# Patient Record
Sex: Female | Born: 1975 | Race: Black or African American | Hispanic: No | Marital: Single | Smoking: Never smoker
Health system: Southern US, Community
[De-identification: ages and names within clinical notes are randomized; demographics above are authoritative.]

## PROBLEM LIST (undated history)

## (undated) DIAGNOSIS — R51 Headache: Secondary | ICD-10-CM

## (undated) DIAGNOSIS — I10 Essential (primary) hypertension: Secondary | ICD-10-CM

## (undated) DIAGNOSIS — O139 Gestational [pregnancy-induced] hypertension without significant proteinuria, unspecified trimester: Secondary | ICD-10-CM

---

## 2011-01-23 ENCOUNTER — Inpatient Hospital Stay (HOSPITAL_COMMUNITY)
Admission: AD | Admit: 2011-01-23 | Discharge: 2011-01-23 | Disposition: A | Discharge status: Home / Self Care | Payer: Medicaid Other | Source: Ambulatory Visit | Attending: Family Medicine | Admitting: Family Medicine

## 2011-01-23 DIAGNOSIS — O0930 Supervision of pregnancy with insufficient antenatal care, unspecified trimester: Secondary | ICD-10-CM | POA: Insufficient documentation

## 2011-01-23 DIAGNOSIS — O479 False labor, unspecified: Secondary | ICD-10-CM

## 2011-01-23 DIAGNOSIS — O4700 False labor before 37 completed weeks of gestation, unspecified trimester: Secondary | ICD-10-CM | POA: Insufficient documentation

## 2011-01-23 LAB — URINALYSIS, ROUTINE W REFLEX MICROSCOPIC
Nitrite: NEGATIVE
Specific Gravity, Urine: >1.03 — ABNORMAL HIGH (ref 1.005–1.030)
pH: 6 (ref 5.0–8.0)

## 2011-04-12 ENCOUNTER — Other Ambulatory Visit (HOSPITAL_COMMUNITY): Payer: Self-pay | Admitting: Obstetrics & Gynecology

## 2011-04-12 DIAGNOSIS — O3660X0 Maternal care for excessive fetal growth, unspecified trimester, not applicable or unspecified: Secondary | ICD-10-CM

## 2011-04-12 DIAGNOSIS — O480 Post-term pregnancy: Secondary | ICD-10-CM

## 2011-04-14 ENCOUNTER — Encounter (HOSPITAL_COMMUNITY): Payer: Self-pay

## 2011-04-14 ENCOUNTER — Ambulatory Visit (HOSPITAL_COMMUNITY): Payer: Medicaid Other

## 2011-04-14 ENCOUNTER — Other Ambulatory Visit (HOSPITAL_COMMUNITY): Payer: Self-pay | Admitting: Obstetrics & Gynecology

## 2011-04-14 ENCOUNTER — Ambulatory Visit (HOSPITAL_COMMUNITY)
Admission: RE | Admit: 2011-04-14 | Discharge: 2011-04-14 | Disposition: A | Discharge status: Home / Self Care | Payer: Medicaid Other | Source: Ambulatory Visit | Attending: Obstetrics & Gynecology | Admitting: Obstetrics & Gynecology

## 2011-04-14 DIAGNOSIS — O3660X0 Maternal care for excessive fetal growth, unspecified trimester, not applicable or unspecified: Secondary | ICD-10-CM

## 2011-04-14 DIAGNOSIS — O365990 Maternal care for other known or suspected poor fetal growth, unspecified trimester, not applicable or unspecified: Secondary | ICD-10-CM | POA: Insufficient documentation

## 2011-04-14 DIAGNOSIS — O139 Gestational [pregnancy-induced] hypertension without significant proteinuria, unspecified trimester: Secondary | ICD-10-CM | POA: Insufficient documentation

## 2011-04-14 DIAGNOSIS — O480 Post-term pregnancy: Secondary | ICD-10-CM | POA: Insufficient documentation

## 2011-04-18 ENCOUNTER — Inpatient Hospital Stay (HOSPITAL_COMMUNITY)
Admission: RE | Admit: 2011-04-18 | Discharge: 2011-04-21 | DRG: 775 | Disposition: A | Discharge status: Home / Self Care | Payer: Medicaid Other | Source: Ambulatory Visit | Attending: Obstetrics & Gynecology | Admitting: Obstetrics & Gynecology

## 2011-04-18 DIAGNOSIS — O139 Gestational [pregnancy-induced] hypertension without significant proteinuria, unspecified trimester: Principal | ICD-10-CM | POA: Diagnosis present

## 2011-04-18 LAB — RPR: RPR Ser Ql: NONREACTIVE

## 2011-04-18 LAB — CBC
Hemoglobin: 10 g/dL — ABNORMAL LOW (ref 12.0–15.0)
Hemoglobin: 9.9 g/dL — ABNORMAL LOW (ref 12.0–15.0)
MCHC: 32.4 g/dL (ref 30.0–36.0)
Platelets: 212 10*3/uL (ref 150–400)
Platelets: 199 10*3/uL (ref 150–400)
RBC: 3.51 MIL/uL — ABNORMAL LOW (ref 3.87–5.11)
RBC: 3.48 MIL/uL — ABNORMAL LOW (ref 3.87–5.11)

## 2011-04-18 LAB — ABO/RH: ABO/RH(D): O POS

## 2011-04-19 ENCOUNTER — Encounter (HOSPITAL_COMMUNITY): Payer: Self-pay | Admitting: Obstetrics & Gynecology

## 2011-04-19 LAB — CBC
MCV: 89.1 fL (ref 78.0–100.0)
Platelets: 186 10*3/uL (ref 150–400)
RBC: 3.11 MIL/uL — ABNORMAL LOW (ref 3.87–5.11)
WBC: 13.4 10*3/uL — ABNORMAL HIGH (ref 4.0–10.5)

## 2011-04-20 LAB — COMPREHENSIVE METABOLIC PANEL
ALT: 18 U/L (ref 0–35)
AST: 23 U/L (ref 0–37)
Albumin: 2.2 g/dL — ABNORMAL LOW (ref 3.5–5.2)
Alkaline Phosphatase: 101 U/L (ref 39–117)
CO2: 26 mEq/L (ref 19–32)
Chloride: 104 mEq/L (ref 96–112)
Creatinine, Ser: 0.75 mg/dL (ref 0.50–1.10)
GFR calc non Af Amer: >60 mL/min (ref >60)
Potassium: 3.9 mEq/L (ref 3.5–5.1)
Total Bilirubin: 0.1 mg/dL — ABNORMAL LOW (ref 0.3–1.2)

## 2011-04-20 LAB — URIC ACID: Uric Acid, Serum: 4.9 mg/dL (ref 2.4–7.0)

## 2011-04-20 LAB — URINALYSIS, ROUTINE W REFLEX MICROSCOPIC
Bilirubin Urine: NEGATIVE
Glucose, UA: NEGATIVE mg/dL
Ketones, ur: NEGATIVE mg/dL
Protein, ur: NEGATIVE mg/dL
pH: 6 (ref 5.0–8.0)

## 2011-04-20 LAB — URINE MICROSCOPIC-ADD ON

## 2011-04-21 LAB — DIFFERENTIAL
Basophils Absolute: 0 10*3/uL (ref 0.0–0.1)
Basophils Relative: 0 % (ref 0–1)
Lymphocytes Relative: 29 % (ref 12–46)
Monocytes Absolute: 0.4 10*3/uL (ref 0.1–1.0)
Neutro Abs: 5.2 10*3/uL (ref 1.7–7.7)
Neutrophils Relative %: 60 % (ref 43–77)

## 2011-04-21 LAB — COMPREHENSIVE METABOLIC PANEL
ALT: 25 U/L (ref 0–35)
Albumin: 2.2 g/dL — ABNORMAL LOW (ref 3.5–5.2)
Alkaline Phosphatase: 99 U/L (ref 39–117)
BUN: 8 mg/dL (ref 6–23)
Chloride: 103 mEq/L (ref 96–112)
GFR calc Af Amer: >60 mL/min (ref >60)
Glucose, Bld: 78 mg/dL (ref 70–99)
Potassium: 3.6 mEq/L (ref 3.5–5.1)
Sodium: 135 mEq/L (ref 135–145)
Total Bilirubin: 0.2 mg/dL — ABNORMAL LOW (ref 0.3–1.2)
Total Protein: 6.2 g/dL (ref 6.0–8.3)

## 2011-04-21 LAB — CBC
HCT: 27.5 % — ABNORMAL LOW (ref 36.0–46.0)
Hemoglobin: 8.8 g/dL — ABNORMAL LOW (ref 12.0–15.0)
MCHC: 32 g/dL (ref 30.0–36.0)
WBC: 8.7 10*3/uL (ref 4.0–10.5)

## 2011-06-06 ENCOUNTER — Inpatient Hospital Stay (HOSPITAL_COMMUNITY): Admission: RE | Admit: 2011-06-06 | Discharge: 2011-06-06 | Payer: Medicaid Other | Source: Ambulatory Visit

## 2011-06-08 ENCOUNTER — Encounter (HOSPITAL_COMMUNITY)
Admission: RE | Admit: 2011-06-08 | Discharge: 2011-06-08 | Disposition: A | Discharge status: Home / Self Care | Payer: Medicaid Other | Source: Ambulatory Visit | Attending: Obstetrics & Gynecology | Admitting: Obstetrics & Gynecology

## 2011-06-08 ENCOUNTER — Other Ambulatory Visit: Payer: Self-pay | Admitting: Obstetrics & Gynecology

## 2011-06-08 ENCOUNTER — Encounter (HOSPITAL_COMMUNITY): Payer: Self-pay

## 2011-06-08 HISTORY — DX: Headache: R51

## 2011-06-08 HISTORY — DX: Essential (primary) hypertension: I10

## 2011-06-08 LAB — CBC
Hemoglobin: 11.1 g/dL — ABNORMAL LOW (ref 12.0–15.0)
MCH: 27.6 pg (ref 26.0–34.0)
MCHC: 32.3 g/dL (ref 30.0–36.0)
MCV: 85.6 fL (ref 78.0–100.0)
RBC: 4.02 MIL/uL (ref 3.87–5.11)

## 2011-06-08 LAB — BASIC METABOLIC PANEL
CO2: 28 mEq/L (ref 19–32)
Calcium: 9.2 mg/dL (ref 8.4–10.5)
Creatinine, Ser: 1.06 mg/dL (ref 0.50–1.10)
GFR calc non Af Amer: 59 mL/min — ABNORMAL LOW (ref >60)
Glucose, Bld: 101 mg/dL — ABNORMAL HIGH (ref 70–99)
Sodium: 136 mEq/L (ref 135–145)

## 2011-06-08 LAB — SURGICAL PCR SCREEN: MRSA, PCR: NEGATIVE

## 2011-06-08 NOTE — Patient Instructions (Addendum)
[address]  06/08/2011   Your procedure is scheduled on:  8/29  Enter through the Main Entrance of [facility] at 6 AM.  Pick up the phone at the desk and dial [phone].   Call this number if you have problems the morning of surgery: [phone]   Remember:   Do not eat food:After Midnight.  Do not drink clear liquids: After Midnight.  Take these medicines the morning of surgery with A SIP OF WATER: Labatalol as directed   Do not wear jewelry, make-up or nail polish.  Do not wear lotions, powders, or perfumes. You may wear deodorant.  Do not shave 48 hours prior to surgery.  Do not bring valuables to the hospital.  Contacts, dentures or bridgework may not be worn into surgery.  Leave suitcase in the car. After surgery it may be brought to your room.  For patients admitted to the hospital, checkout time is 11:00 AM the day of discharge.   Patients discharged the day of surgery will not be allowed to drive home.  Name and phone number of your driver: Amada Kingfisher  Boyfriend   Special Instructions: CHG Shower Use Special Wash: 1/2 bottle night before surgery and 1/2 bottle morning of surgery.   Please read over the following fact sheets that you were given: MRSA Information

## 2011-06-08 NOTE — Pre-Procedure Instructions (Signed)
7weeks post partum, breast feeding

## 2011-06-08 NOTE — Anesthesia Preprocedure Evaluation (Signed)
Anesthesia Evaluation  Name, MR# and DOB Patient awake  General Assessment Comment  Reviewed: Allergy & Precautions, H&P , Patient's Chart, lab work & pertinent test results  History of Anesthesia Complications Negative for: history of anesthetic complications  Airway Mallampati: II TM Distance: >3 FB Neck ROM: full    Dental  (+)    Pulmonary  clear to auscultation  breath sounds clear to auscultation none    Cardiovascular hypertension (instructed to take meds on DOS - had not taken them today), On Home Beta Blockers regular Normal    Neuro/Psych   Headaches   Negative Neurological ROS  Negative Psych ROS  GI/Hepatic/Renal negative GI ROS  negative Liver ROS  negative Renal ROS        Endo/Other  (+)   Morbid obesity  Abdominal   Musculoskeletal   Hematology negative hematology ROS (+)   Peds  Reproductive/Obstetrics    Anesthesia Other Findings             Anesthesia Physical Anesthesia Plan  ASA: III  Anesthesia Plan: General   Post-op Pain Management:    Induction:   Airway Management Planned:   Additional Equipment:   Intra-op Plan:   Post-operative Plan:   Informed Consent: I have reviewed the patients History and Physical, chart, labs and discussed the procedure including the risks, benefits and alternatives for the proposed anesthesia with the patient or authorized representative who has indicated his/her understanding and acceptance.   Dental Advisory Given  Plan Discussed with:   Anesthesia Plan Comments:         Anesthesia Quick Evaluation

## 2011-06-13 ENCOUNTER — Encounter (HOSPITAL_COMMUNITY): Payer: Self-pay | Admitting: Obstetrics & Gynecology

## 2011-06-13 NOTE — H&P (Signed)
April Barr is an 35 y.o. G5 P97 female who presents for laparoscopic tubal sterilization.     Past Medical History  Diagnosis Date  . Hypertension   . Headache     Past Surgical History  Procedure Date  . No past surgeries     History reviewed. No pertinent family history.  Social History:  reports that she has never smoked. She has never used smokeless tobacco. She reports that she drinks alcohol. She reports that she does not use illicit drugs.  Allergies: No Known Allergies  No prescriptions prior to admission    Review of Systems  Constitutional: Negative.   Eyes: Negative.   Respiratory: Negative.   Cardiovascular: Negative.   Gastrointestinal: Negative.   Genitourinary: Negative.   Musculoskeletal: Negative.   Skin: Negative.   Neurological: Positive for headaches.  Psychiatric/Behavioral: Negative.     unknown if currently breastfeeding. Physical Exam  Constitutional: She appears well-developed and well-nourished.  Eyes: Pupils are equal, round, and reactive to light.  Neck: Neck supple.  Cardiovascular: Normal rate and regular rhythm.   Respiratory: Effort normal and breath sounds normal.  GI: Soft.  Genitourinary: Vagina normal and uterus normal.  Musculoskeletal: Normal range of motion.  Neurological: She is alert.  Skin: Skin is warm and dry.  Psychiatric: She has a normal mood and affect.    Assessment/Plan: Laparoscopic tubal sterilization.  Patient informed that procedure is permanent and has a 11/998 failure rate.  KAPLAN,RICHARD D 06/13/2011, 5:01 PM

## 2011-06-14 ENCOUNTER — Encounter (HOSPITAL_COMMUNITY): Payer: Self-pay | Admitting: *Deleted

## 2011-06-14 ENCOUNTER — Encounter (HOSPITAL_COMMUNITY): Payer: Self-pay | Admitting: Anesthesiology

## 2011-06-14 ENCOUNTER — Ambulatory Visit (HOSPITAL_COMMUNITY): Payer: Medicaid Other | Admitting: Anesthesiology

## 2011-06-14 ENCOUNTER — Ambulatory Visit (HOSPITAL_COMMUNITY)
Admission: RE | Admit: 2011-06-14 | Discharge: 2011-06-14 | Disposition: A | Discharge status: Home / Self Care | Payer: Medicaid Other | Source: Ambulatory Visit | Attending: Obstetrics & Gynecology | Admitting: Obstetrics & Gynecology

## 2011-06-14 ENCOUNTER — Encounter (HOSPITAL_COMMUNITY)
Admission: RE | Disposition: A | Discharge status: Home / Self Care | Payer: Self-pay | Source: Ambulatory Visit | Attending: Obstetrics & Gynecology

## 2011-06-14 DIAGNOSIS — Z302 Encounter for sterilization: Secondary | ICD-10-CM | POA: Insufficient documentation

## 2011-06-14 HISTORY — PX: LAPAROSCOPIC TUBAL LIGATION: SHX1937

## 2011-06-14 SURGERY — LIGATION, FALLOPIAN TUBE, LAPAROSCOPIC
Anesthesia: General | Site: Abdomen | Laterality: Bilateral | Wound class: Clean Contaminated

## 2011-06-14 MED ORDER — OXYCODONE-ACETAMINOPHEN 5-325 MG PO TABS: 2.0000 | ORAL_TABLET | ORAL | Status: AC | PRN

## 2011-06-14 MED ORDER — ROCURONIUM BROMIDE 50 MG/5ML IV SOLN
INTRAVENOUS | Status: AC
  Filled 2011-06-14: qty 2

## 2011-06-14 MED ORDER — MIDAZOLAM HCL 2 MG/2ML IJ SOLN
INTRAMUSCULAR | Status: AC
  Filled 2011-06-14: qty 2

## 2011-06-14 MED ORDER — FENTANYL CITRATE 0.05 MG/ML IJ SOLN
INTRAMUSCULAR | Status: AC
  Filled 2011-06-14: qty 5

## 2011-06-14 MED ORDER — SUCCINYLCHOLINE CHLORIDE 20 MG/ML IJ SOLN
INTRAMUSCULAR | Status: DC | PRN
  Administered 2011-06-14: 100 mg via INTRAVENOUS

## 2011-06-14 MED ORDER — LIDOCAINE HCL (CARDIAC) 20 MG/ML IV SOLN
INTRAVENOUS | Status: DC | PRN
  Administered 2011-06-14: 100 mg via INTRAVENOUS

## 2011-06-14 MED ORDER — ONDANSETRON HCL 4 MG/2ML IJ SOLN
INTRAMUSCULAR | Status: DC | PRN
  Administered 2011-06-14: 4 mg via INTRAVENOUS

## 2011-06-14 MED ORDER — GLYCOPYRROLATE 0.2 MG/ML IJ SOLN
INTRAMUSCULAR | Status: AC
  Filled 2011-06-14: qty 2

## 2011-06-14 MED ORDER — GLYCOPYRROLATE 0.2 MG/ML IJ SOLN
INTRAMUSCULAR | Status: DC | PRN
  Administered 2011-06-14: 0.1 mg via INTRAVENOUS

## 2011-06-14 MED ORDER — KETOROLAC TROMETHAMINE 30 MG/ML IJ SOLN
INTRAMUSCULAR | Status: DC | PRN
  Administered 2011-06-14: 30 mg via INTRAVENOUS

## 2011-06-14 MED ORDER — LACTATED RINGERS IV SOLN
INTRAVENOUS | Status: DC
  Administered 2011-06-14: 09:00:00 via INTRAVENOUS
  Administered 2011-06-14: 1000 mL via INTRAVENOUS

## 2011-06-14 MED ORDER — LIDOCAINE HCL (CARDIAC) 20 MG/ML IV SOLN
INTRAVENOUS | Status: AC
  Filled 2011-06-14: qty 5

## 2011-06-14 MED ORDER — FENTANYL CITRATE 0.05 MG/ML IJ SOLN
INTRAMUSCULAR | Status: DC | PRN
  Administered 2011-06-14 (×2): 50 ug via INTRAVENOUS
  Administered 2011-06-14: 100 ug via INTRAVENOUS

## 2011-06-14 MED ORDER — ROCURONIUM BROMIDE 100 MG/10ML IV SOLN
INTRAVENOUS | Status: DC | PRN
  Administered 2011-06-14: 10 mg via INTRAVENOUS

## 2011-06-14 MED ORDER — DEXAMETHASONE SODIUM PHOSPHATE 10 MG/ML IJ SOLN
INTRAMUSCULAR | Status: AC
  Filled 2011-06-14: qty 1

## 2011-06-14 MED ORDER — NEOSTIGMINE METHYLSULFATE 1 MG/ML IJ SOLN
INTRAMUSCULAR | Status: AC
  Filled 2011-06-14: qty 10

## 2011-06-14 MED ORDER — FENTANYL CITRATE 0.05 MG/ML IJ SOLN: 1.0000 ug/kg | INTRAMUSCULAR | Status: DC | PRN

## 2011-06-14 MED ORDER — DEXAMETHASONE SODIUM PHOSPHATE 10 MG/ML IJ SOLN
INTRAMUSCULAR | Status: DC | PRN
  Administered 2011-06-14: 10 mg via INTRAVENOUS

## 2011-06-14 MED ORDER — ONDANSETRON HCL 4 MG/2ML IJ SOLN
INTRAMUSCULAR | Status: AC
  Filled 2011-06-14: qty 2

## 2011-06-14 MED ORDER — PROPOFOL 10 MG/ML IV EMUL
INTRAVENOUS | Status: AC
  Filled 2011-06-14: qty 20

## 2011-06-14 MED ORDER — MIDAZOLAM HCL 5 MG/5ML IJ SOLN
INTRAMUSCULAR | Status: DC | PRN
  Administered 2011-06-14: 2 mg via INTRAVENOUS

## 2011-06-14 MED ORDER — PROPOFOL 10 MG/ML IV EMUL
INTRAVENOUS | Status: DC | PRN
  Administered 2011-06-14: 200 mg via INTRAVENOUS

## 2011-06-14 MED ORDER — SUCCINYLCHOLINE CHLORIDE 20 MG/ML IJ SOLN
INTRAMUSCULAR | Status: AC
  Filled 2011-06-14: qty 1

## 2011-06-14 SURGICAL SUPPLY — 12 items
CATH ROBINSON RED A/P 16FR (CATHETERS) ×2 IMPLANT
CLOTH BEACON ORANGE TIMEOUT ST (SAFETY) ×2 IMPLANT
DERMABOND ADVANCED (GAUZE/BANDAGES/DRESSINGS) ×2 IMPLANT
GLOVE ECLIPSE 6.0 STRL STRAW (GLOVE) ×2 IMPLANT
GLOVE ECLIPSE 6.5 STRL STRAW (GLOVE) ×2 IMPLANT
GOWN PREVENTION PLUS LG XLONG (DISPOSABLE) ×4 IMPLANT
NEEDLE SPNL 20GX3.5 QUINCKE YW (NEEDLE) IMPLANT
PACK LAPAROSCOPY BASIN (CUSTOM PROCEDURE TRAY) ×2 IMPLANT
STRIP CLOSURE SKIN 1/4X3 (GAUZE/BANDAGES/DRESSINGS) ×2 IMPLANT
SUT VICRYL 4-0 PS2 18IN ABS (SUTURE) IMPLANT
TOWEL OR 17X24 6PK STRL BLUE (TOWEL DISPOSABLE) ×4 IMPLANT
WATER STERILE IRR 1000ML POUR (IV SOLUTION) ×2 IMPLANT

## 2011-06-14 NOTE — Anesthesia Procedure Notes (Signed)
Procedure Name: Intubation Date/Time: 06/14/2011 7:39 AM Performed by: Karleen Dolphin Pre-anesthesia Checklist: Patient identified, Timeout performed, Emergency Drugs available, Suction available and Patient being monitored Patient Re-evaluated:Patient Re-evaluated prior to inductionOxygen Delivery Method: Circle System Utilized Preoxygenation: Pre-oxygenation with 100% oxygen Intubation Type: IV induction Ventilation: Mask ventilation with difficulty, Two handed mask ventilation required and Oral airway inserted - appropriate to patient size Laryngoscope Size: Mac and 3 Grade View: Grade I Airway Equipment and Method: stylet Placement Confirmation: ETT inserted through vocal cords under direct vision,  positive ETCO2,  CO2 detector and breath sounds checked- equal and bilateral Secured at: 22 cm Tube secured with: Tape Dental Injury: Teeth and Oropharynx as per pre-operative assessment

## 2011-06-14 NOTE — Brief Op Note (Signed)
06/14/2011  8:23 AM  PATIENT:  April Barr  35 y.o. female  PRE-OPERATIVE DIAGNOSIS:  desire sterilization  POST-OPERATIVE DIAGNOSIS:  desire sterilization  PROCEDURE:  Procedure(s): LAPAROSCOPIC TUBAL LIGATION  SURGEON:  Surgeon(s): Mickel Baas  PHYSICIAN ASSISTANT:   ASSISTANTS: none   ANESTHESIA:   general  ESTIMATED BLOOD LOSS: * No blood loss amount entered *   BLOOD ADMINISTERED:none  DRAINS: none   LOCAL MEDICATIONS USED:  NONE  SPECIMEN:  No Specimen  DISPOSITION OF SPECIMEN:  N/A  COUNTS:  YES  TOURNIQUET:  * No tourniquets in log *  DICTATION #: [id number]   PLAN OF CARE: Discharge from PACU when ready  PATIENT DISPOSITION:  PACU - hemodynamically stable.   Delay start of Pharmacological VTE agent (>24hrs) due to surgical blood loss or risk of bleeding:  no

## 2011-06-14 NOTE — Transfer of Care (Signed)
  Anesthesia Post-op Note  Patient: April Barr  Procedure(s) Performed:  LAPAROSCOPIC TUBAL LIGATION  Patient Location: PACU  Anesthesia Type: General  Level of Consciousness: awake, alert  and oriented  Airway and Oxygen Therapy: Patient Spontanous Breathing  Post-op Pain: none  Post-op Assessment: Post-op Vital signs reviewed and Patient's Cardiovascular Status Stable  Post-op Vital Signs: Reviewed and stable  Complications: No apparent anesthesia complications
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
## 2011-06-15 NOTE — Anesthesia Postprocedure Evaluation (Signed)
  Anesthesia Post-op Note  Patient: April Barr  Procedure(s) Performed:  LAPAROSCOPIC TUBAL LIGATION  Patient is awake and responsive. Pain and nausea are reasonably well controlled. Vital signs are stable and clinically acceptable. Oxygen saturation is clinically acceptable. There are no apparent anesthetic complications at this time. Patient is ready for discharge.

## 2011-06-27 ENCOUNTER — Encounter (HOSPITAL_COMMUNITY): Payer: Self-pay | Admitting: Obstetrics & Gynecology

## 2011-11-24 ENCOUNTER — Encounter (HOSPITAL_COMMUNITY): Payer: Self-pay | Admitting: *Deleted

## 2011-11-24 ENCOUNTER — Inpatient Hospital Stay (HOSPITAL_COMMUNITY): Payer: Medicaid Other

## 2011-11-24 ENCOUNTER — Other Ambulatory Visit (HOSPITAL_COMMUNITY): Payer: Medicaid Other

## 2011-11-24 ENCOUNTER — Encounter (HOSPITAL_COMMUNITY): Payer: Self-pay

## 2011-11-24 ENCOUNTER — Inpatient Hospital Stay (HOSPITAL_COMMUNITY)
Admission: AD | Admit: 2011-11-24 | Discharge: 2011-11-24 | Disposition: A | Discharge status: Home / Self Care | Payer: Medicaid Other | Source: Ambulatory Visit | Attending: Obstetrics and Gynecology | Admitting: Obstetrics and Gynecology

## 2011-11-24 DIAGNOSIS — N921 Excessive and frequent menstruation with irregular cycle: Secondary | ICD-10-CM

## 2011-11-24 DIAGNOSIS — N920 Excessive and frequent menstruation with regular cycle: Secondary | ICD-10-CM | POA: Insufficient documentation

## 2011-11-24 HISTORY — DX: Gestational (pregnancy-induced) hypertension without significant proteinuria, unspecified trimester: O13.9

## 2011-11-24 LAB — URINE MICROSCOPIC-ADD ON

## 2011-11-24 LAB — WET PREP, GENITAL: Trich, Wet Prep: NONE SEEN

## 2011-11-24 LAB — URINALYSIS, ROUTINE W REFLEX MICROSCOPIC
Glucose, UA: NEGATIVE mg/dL
Ketones, ur: NEGATIVE mg/dL
Specific Gravity, Urine: 1.015 (ref 1.005–1.030)
pH: 6 (ref 5.0–8.0)

## 2011-11-24 LAB — CBC
Hemoglobin: 11.7 g/dL — ABNORMAL LOW (ref 12.0–15.0)
MCH: 26.6 pg (ref 26.0–34.0)
MCV: 85 fL (ref 78.0–100.0)
RBC: 4.4 MIL/uL (ref 3.87–5.11)
WBC: 7.2 10*3/uL (ref 4.0–10.5)

## 2011-11-24 LAB — POCT PREGNANCY, URINE: Preg Test, Ur: NEGATIVE

## 2011-11-24 MED ORDER — ONDANSETRON HCL 4 MG PO TABS: 4.0000 mg | ORAL_TABLET | Freq: Three times a day (TID) | ORAL | Status: AC | PRN

## 2011-11-24 MED ORDER — FERROUS SULFATE 28 MG PO TABS: 28.0000 mg | ORAL_TABLET | Freq: Every day | ORAL | Status: AC

## 2011-11-24 MED ORDER — MEDROXYPROGESTERONE ACETATE 10 MG PO TABS: 10.0000 mg | ORAL_TABLET | Freq: Every day | ORAL | Status: AC

## 2011-11-24 MED ORDER — HYDROCHLOROTHIAZIDE 12.5 MG PO CAPS: 12.5000 mg | ORAL_CAPSULE | Freq: Every day | ORAL | Status: AC

## 2011-11-24 NOTE — ED Provider Notes (Signed)
History    36yo presents to MAU with heavy period x2 weeks.  She reports irregular periods since BTL 05/2011 and hx of anemia. She is having mild abdominal cramping associated with her bleeding.  She denies dizziness, fatigue, or h/a.  Reports normal, regular periods before last pregnancy except for short time when she had ovarian cysts.  Chief Complaint  Patient presents with  . Vaginal Bleeding   HPI  OB History    Grav Para Term Preterm Abortions TAB SAB Ect Mult Living   6 4 4  0 1 1 0 0 0 4    Pertinent Gynecological History: Menses: Pt reports NLMP on 10/17/11 but then had spotting until 1/21 when heavy bleeding started. Bleeding: dysfunctional uterine bleeding Contraception: tubal ligation DES exposure: denies Blood transfusions: none Sexually transmitted diseases: no past history Last mammogram: None Date: n/a Last pap: normal Date: 10/2010     Past Medical History  Diagnosis Date  . Hypertension   . Headache   . Pregnancy induced hypertension     Past Surgical History  Procedure Date  . Laparoscopic tubal ligation 06/14/2011    Procedure: LAPAROSCOPIC TUBAL LIGATION;  Surgeon: Mickel Baas;  Location: [facility];  Service: Gynecology;  Laterality: Bilateral;    Family History  Problem Relation Age of Onset  . Anesthesia problems Neg Hx     History  Substance Use Topics  . Smoking status: Never Smoker   . Smokeless tobacco: Never Used  . Alcohol Use: Yes     occasionally    Allergies: No Known Allergies  Prescriptions prior to admission  Medication Sig Dispense Refill  . ibuprofen (ADVIL,MOTRIN) 200 MG tablet Take 200 mg by mouth as needed. For headache         Review of Systems  Musculoskeletal: Negative.   Psychiatric/Behavioral: Negative.    Pertinent ROS in HPI  Physical Exam   Blood pressure 148/100, pulse 83, temperature 97.2 F (36.2 C), temperature source Oral, resp. rate 20, height 5\' 4"  (1.626 m), weight 117.482 kg (259 lb), last  menstrual period 11/09/2011, SpO2 99.00%, not currently breastfeeding.  Physical Exam  Constitutional: She is oriented to person, place, and time. She appears well-developed and well-nourished.  Cardiovascular: Normal rate, regular rhythm and normal heart sounds.   Respiratory: Effort normal and breath sounds normal.  GI: Soft.  Neurological: She is alert and oriented to person, place, and time.  Skin: Skin is warm and dry.  Psychiatric: She has a normal mood and affect. Her behavior is normal. Judgment and thought content normal.   Pelvic exam: Cervix pink, bleeding noted from cervical os, pooling of blood and clots in vagina.  Bimanual exam:  Cervix closed/thick/high, soft, adnexa without enlargement or masses, uterus slightly enlarged from nonpregnant size  Imaging Findings from transvaginal U/S:  The uterus is normal in size, measuring 10.6 x 5.0 x 6.3 cm. No  fibroids are identified. The endometrial stripe is slightly  heterogeneous and thickened, measuring 19 mm.  Both ovaries have normal size and appearance. The right ovary  measures 4.3 x 1.8 x 2.6 cm, and the left ovary measures 3.1 x 1.7  x 2.2 cm. There are no adnexal masses or free pelvic fluid.  IMPRESSION:  Mild heterogeneity and thickening of the endometrial stripe,  measuring 19 mm. Follow up with ultrasound in 6 weeks is  recommended to assess resolution during a different phase of the  menstrual cycle. Marland Kitchen  MAU Course  Procedures Pelvic exam with wet prep, GC/Chlamydia  U/S  Assessment and Plan  A: Menometrorrhagia  Hx of gestational hypertension  P: D/C home with bleeding precautions Prescription for Provera x10 days, PO iron supplement, PO Zofran F/U with GYN provider at [facility] in next 2 weeks regarding bleeding and HTN  LEFTWICH-KIRBY, Coryn Mosso 11/24/2011, 2:11 PM

## 2011-11-24 NOTE — Progress Notes (Addendum)
Period started 01/24, was spotting before/having abd pain and cramping.  Since bleeding started  Has gotten heavier and is passing clots.  Tubes tied in August, since then has not been regular.

## 2011-11-24 NOTE — Progress Notes (Signed)
Pt here Feb 8th as postpartum and delivery record not signed,  still showing 70+week preg

## 2011-11-24 NOTE — Progress Notes (Signed)
L Leftwich-Kirby CNM notified of BP 173/103 at discharge. Added new discharge order and okayed patient to still be discharged.

## 2011-11-25 NOTE — ED Provider Notes (Signed)
Agree with above note.  April Barr 11/25/2011 6:06 AM   

## 2011-11-28 ENCOUNTER — Encounter (HOSPITAL_COMMUNITY): Payer: Self-pay | Admitting: *Deleted

## 2012-11-08 IMAGING — US US TRANSVAGINAL NON-OB
1 series · 14 of 25 positions shown · non-contrast
Comparison: None.

CLINICAL DATA: Abnormal uterine bleeding, pain

TRANSABDOMINAL AND TRANSVAGINAL ULTRASOUND OF PELVIS
TECHNIQUE: Both transabdominal and transvaginal ultrasound
examinations of the pelvis were performed. Transabdominal technique
was performed for global imaging of the pelvis including uterus,
ovaries, adnexal regions, and pelvic cul-de-sac.

[Series 1: us pelvis complete · 14 of 46 slices shown]
[im 1/46]
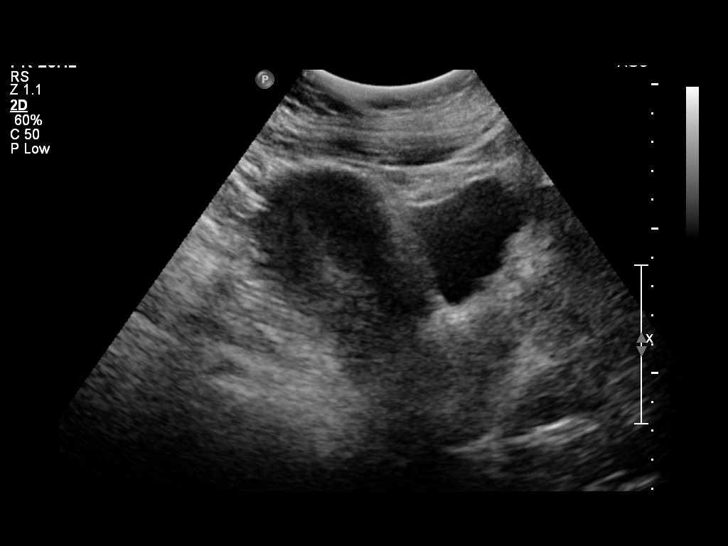
[im 4/46]
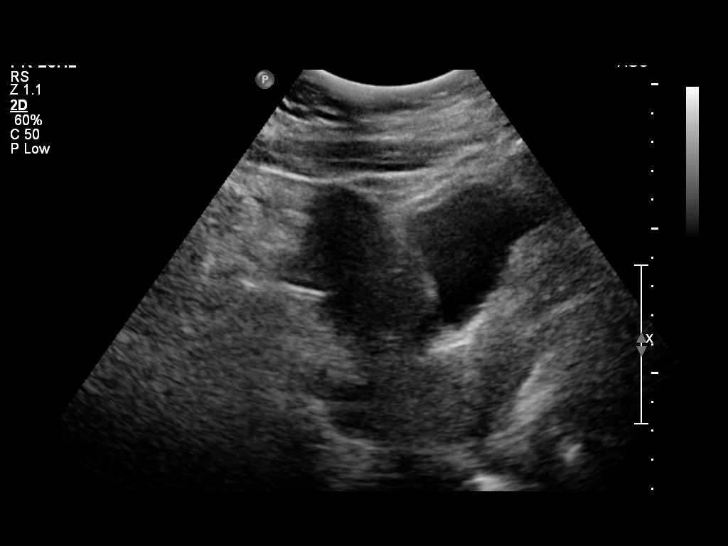
[im 8/46]
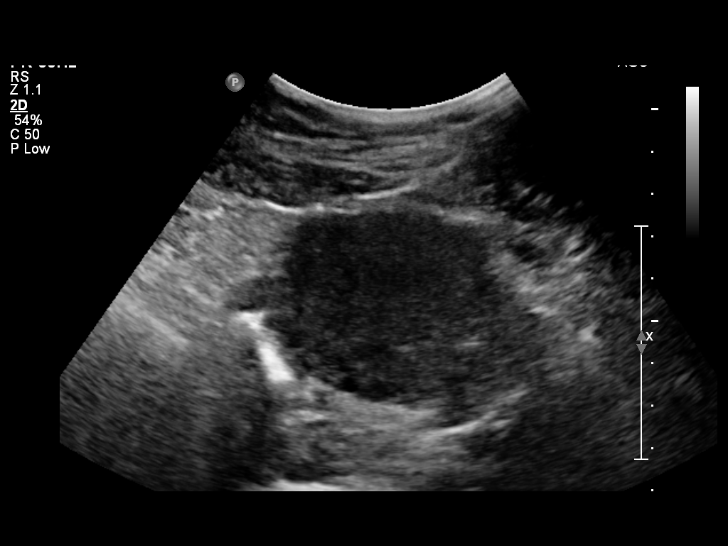
[im 12/46]
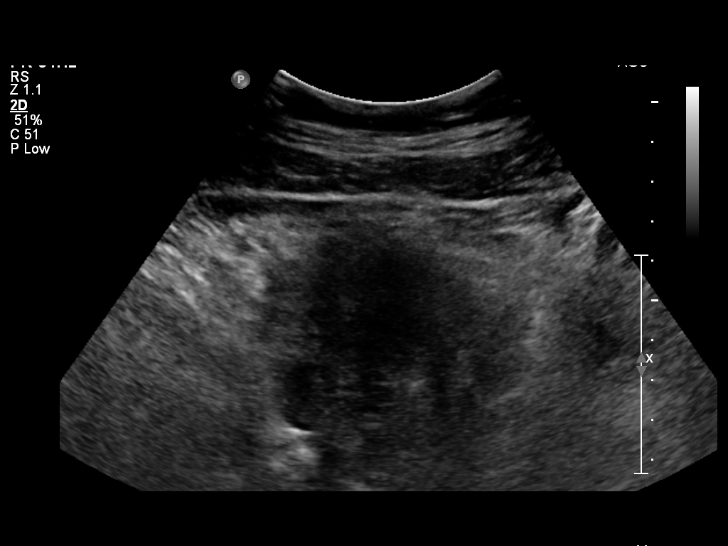
[im 16/46]
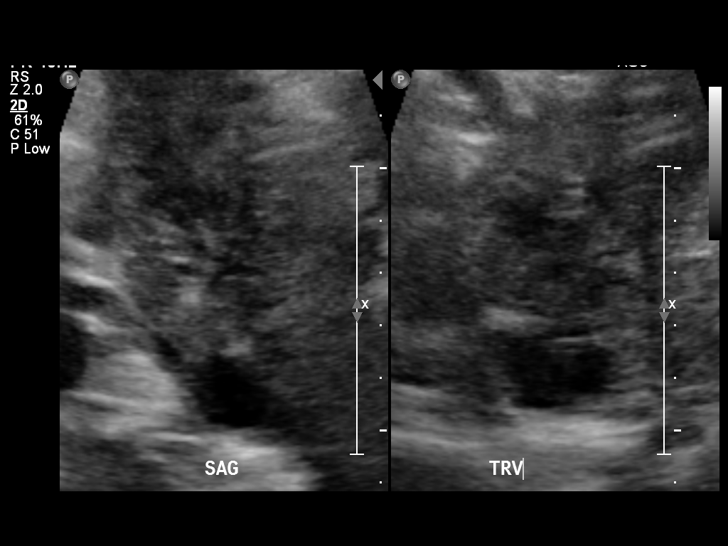
[im 17/46]
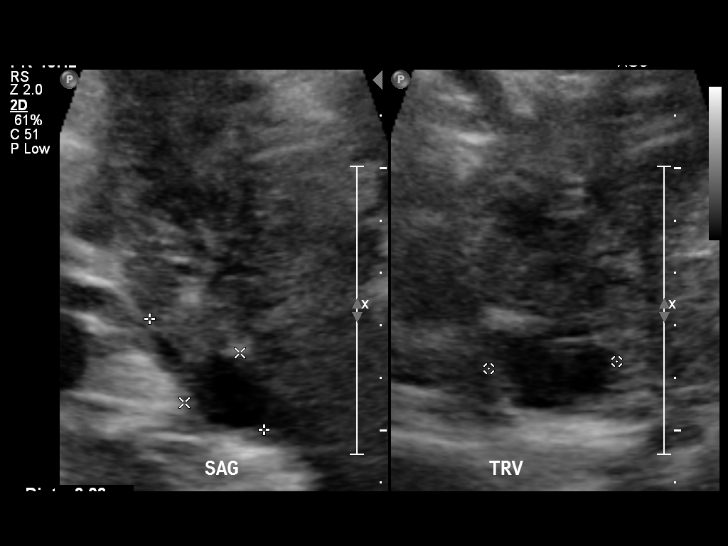
[im 21/46]
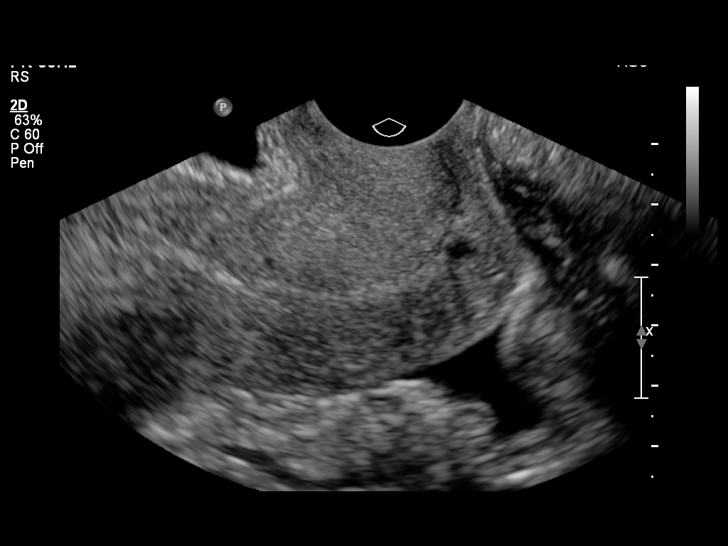
[im 25/46]
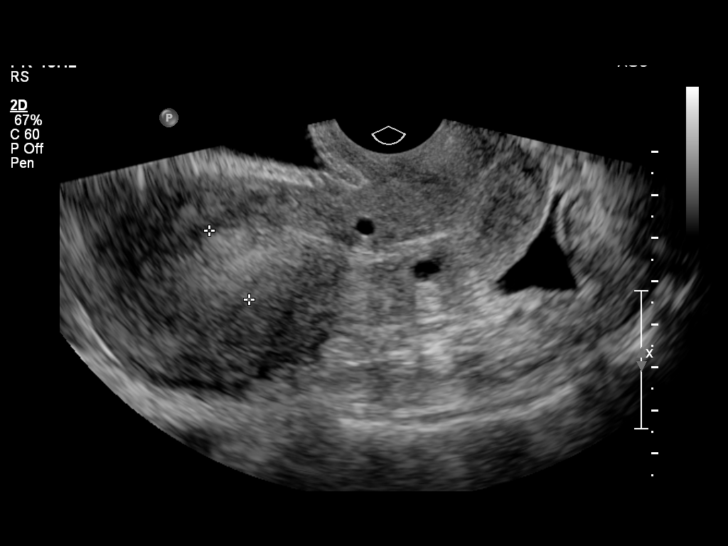
[im 29/46]
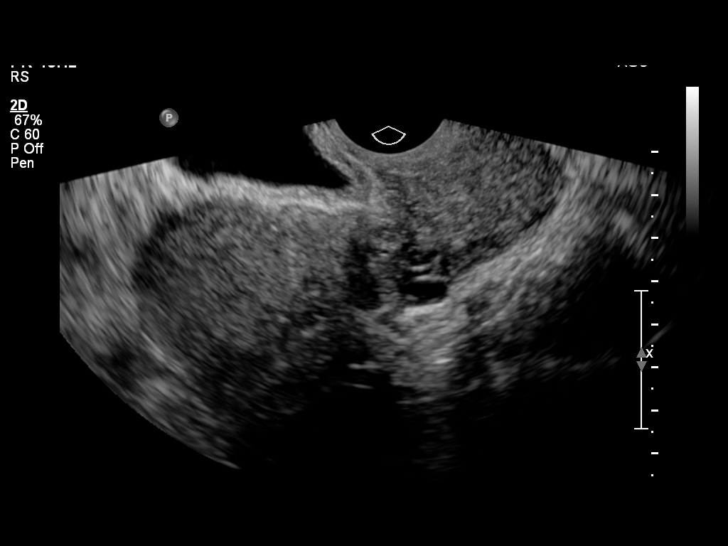
[im 31/46]
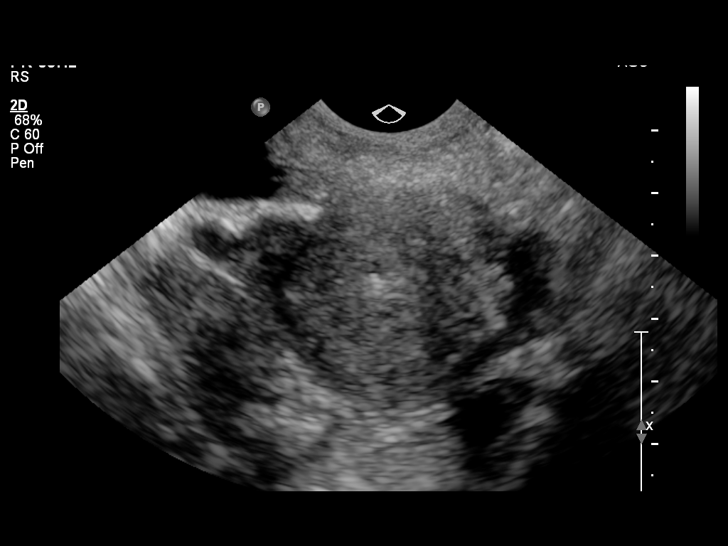
[im 34/46]
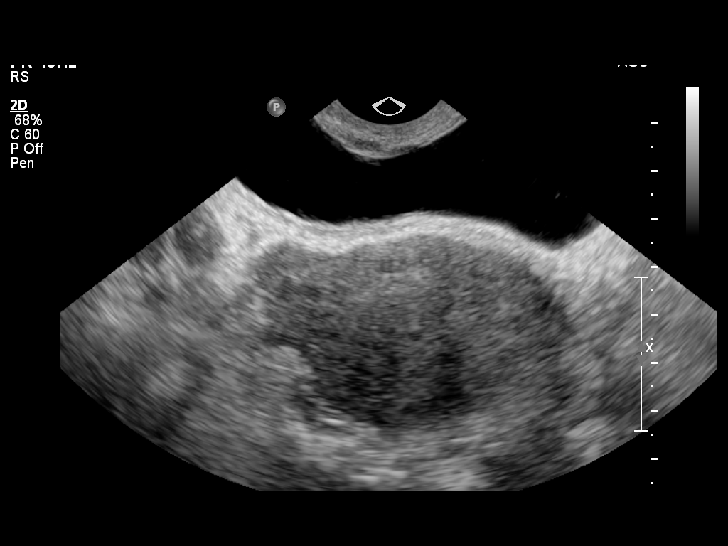
[im 38/46]
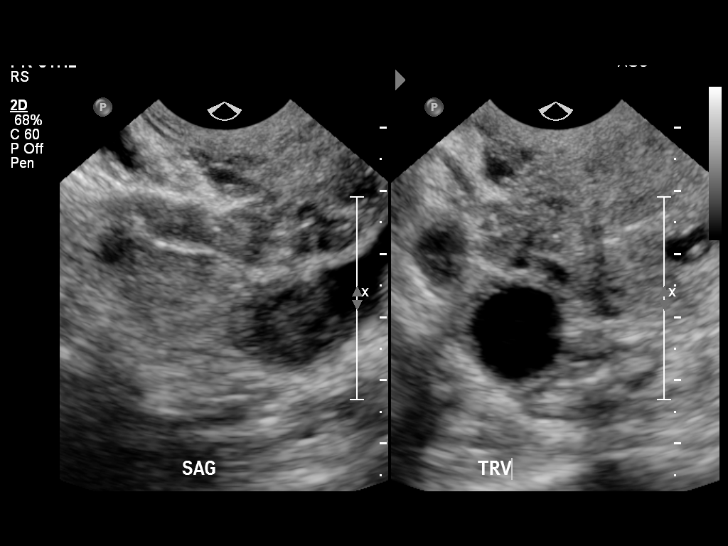
[im 42/46]
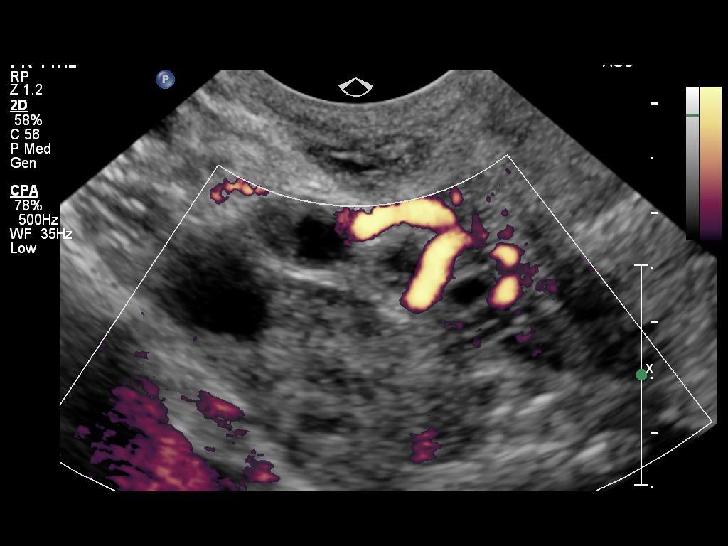
[im 46/46]
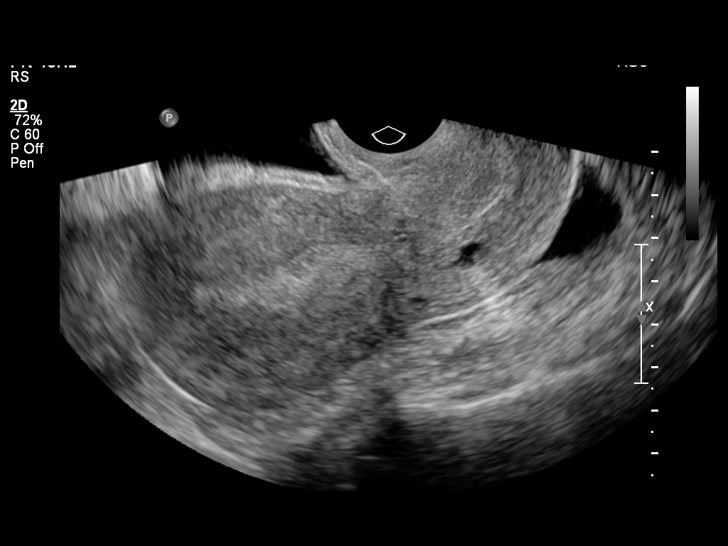

[14 of 25 positions shown; findings below may reference images not displayed]

It was necessary to proceed with endovaginal exam following the
transabdominal exam to visualize the endometrium and adnexa.
FINDINGS: The uterus is normal in size, measuring 10.6 x 5.0 x 6.3 cm.  No
fibroids are identified.  The endometrial stripe is slightly
heterogeneous and thickened, measuring 19 mm.

Both ovaries have normal size and appearance.  The right ovary
measures 4.3 x 1.8 x 2.6 cm, and the left ovary measures 3.1 x
x 2.2 cm.  There are no adnexal masses or free pelvic fluid.
IMPRESSION: Mild heterogeneity and thickening of the endometrial stripe,
measuring 19 mm.  Follow up with ultrasound in 6 weeks is
recommended to assess resolution during a different phase of the
menstrual cycle. .

## 2014-08-17 ENCOUNTER — Encounter (HOSPITAL_COMMUNITY): Payer: Self-pay | Admitting: *Deleted

## 2015-04-16 ENCOUNTER — Encounter (HOSPITAL_COMMUNITY): Payer: Self-pay | Admitting: Emergency Medicine

## 2015-04-16 ENCOUNTER — Emergency Department (HOSPITAL_COMMUNITY)
Admission: EM | Admit: 2015-04-16 | Discharge: 2015-04-16 | Disposition: A | Discharge status: Home / Self Care | Payer: Medicaid Other | Attending: Emergency Medicine | Admitting: Emergency Medicine

## 2015-04-16 DIAGNOSIS — I10 Essential (primary) hypertension: Secondary | ICD-10-CM

## 2015-04-16 DIAGNOSIS — R2231 Localized swelling, mass and lump, right upper limb: Secondary | ICD-10-CM | POA: Insufficient documentation

## 2015-04-16 DIAGNOSIS — Z79899 Other long term (current) drug therapy: Secondary | ICD-10-CM | POA: Insufficient documentation

## 2015-04-16 NOTE — ED Notes (Signed)
Pt. reports " knot" at right forearm for several weeks with no pain , denies injury.

## 2015-04-16 NOTE — Discharge Instructions (Signed)
Return to the emergency room with worsening of symptoms, new symptoms or with symptoms that are concerning, especially fevers, increased size redness, swelling, red streaks. Please call your doctor for a followup appointment within 24-48 hours. When you talk to your doctor please let them know that you were seen in the emergency department and have them acquire all of your records so that they can discuss the findings with you and formulate a treatment plan to fully care for your new and ongoing problems. If you do not have a primary care provider please call the number below under ED resources to establish care with a provider and follow up.  Read below information and follow recommendations.  Hypertension Hypertension, commonly called high blood pressure, is when the force of blood pumping through your arteries is too strong. Your arteries are the blood vessels that carry blood from your heart throughout your body. A blood pressure reading consists of a higher number over a lower number, such as 110/72. The higher number (systolic) is the pressure inside your arteries when your heart pumps. The lower number (diastolic) is the pressure inside your arteries when your heart relaxes. Ideally you want your blood pressure below 120/80. Hypertension forces your heart to work harder to pump blood. Your arteries may become narrow or stiff. Having hypertension puts you at risk for heart disease, stroke, and other problems.  RISK FACTORS Some risk factors for high blood pressure are controllable. Others are not.  Risk factors you cannot control include:   Race. You may be at higher risk if you are African American.  Age. Risk increases with age.  Gender. Men are at higher risk than women before age 5 years. After age 11, women are at higher risk than men. Risk factors you can control include:  Not getting enough exercise or physical activity.  Being overweight.  Getting too much fat, sugar, calories,  or salt in your diet.  Drinking too much alcohol. SIGNS AND SYMPTOMS Hypertension does not usually cause signs or symptoms. Extremely high blood pressure (hypertensive crisis) may cause headache, anxiety, shortness of breath, and nosebleed. DIAGNOSIS  To check if you have hypertension, your health care provider will measure your blood pressure while you are seated, with your arm held at the level of your heart. It should be measured at least twice using the same arm. Certain conditions can cause a difference in blood pressure between your right and left arms. A blood pressure reading that is higher than normal on one occasion does not mean that you need treatment. If one blood pressure reading is high, ask your health care provider about having it checked again. TREATMENT  Treating high blood pressure includes making lifestyle changes and possibly taking medicine. Living a healthy lifestyle can help lower high blood pressure. You may need to change some of your habits. Lifestyle changes may include:  Following the DASH diet. This diet is high in fruits, vegetables, and whole grains. It is low in salt, red meat, and added sugars.  Getting at least 2 hours of brisk physical activity every week.  Losing weight if necessary.  Not smoking.  Limiting alcoholic beverages.  Learning ways to reduce stress. If lifestyle changes are not enough to get your blood pressure under control, your health care provider may prescribe medicine. You may need to take more than one. Work closely with your health care provider to understand the risks and benefits. HOME CARE INSTRUCTIONS  Have your blood pressure rechecked as directed by  your health care provider.   Take medicines only as directed by your health care provider. Follow the directions carefully. Blood pressure medicines must be taken as prescribed. The medicine does not work as well when you skip doses. Skipping doses also puts you at risk for  problems.   Do not smoke.   Monitor your blood pressure at home as directed by your health care provider. SEEK MEDICAL CARE IF:   You think you are having a reaction to medicines taken.  You have recurrent headaches or feel dizzy.  You have swelling in your ankles.  You have trouble with your vision. SEEK IMMEDIATE MEDICAL CARE IF:  You develop a severe headache or confusion.  You have unusual weakness, numbness, or feel faint.  You have severe chest or abdominal pain.  You vomit repeatedly.  You have trouble breathing. MAKE SURE YOU:   Understand these instructions.  Will watch your condition.  Will get help right away if you are not doing well or get worse. Document Released: 10/02/2005 Document Revised: 02/16/2014 Document Reviewed: 07/25/2013 [facility] Patient Information 2015 [place], [place]. This information is not intended to replace advice given to you by your health care provider. Make sure you discuss any questions you have with your health care provider.  Excision of Skin Lesions Excision of a skin lesion refers to the removal of a section of skin by making small cuts (incisions) in the skin. This is typically done to remove a cancerous growth (basal cell carcinoma, squamous cell carcinoma, or melanoma) or a noncancerous growth (cyst). It may be done to treat or prevent cancer or infection. It may also be done to improve cosmetic appearance (removal of mole, skin tag). LET YOUR CAREGIVER KNOW ABOUT:   Allergies to food or medicine.  Medicines taken, including vitamins, herbs, eyedrops, over-the-counter medicines, and creams.  Use of steroids (by mouth or creams).  Previous problems with anesthetics or numbing medicines.  History of bleeding problems or blood clots.  History of any prostheses.  Previous surgery.  Other health problems, including diabetes and kidney problems.  Possibility of pregnancy, if this applies. RISKS AND COMPLICATIONS  Many  complications can be managed. With appropriate treatment and rehabilitation, the following complications are very uncommon:  Bleeding.  Infection.  Scarring.  Recurrence of cyst or cancer.  Changes in skin sensation or appearance (discoloration, swelling).  Reaction to anesthesia.  Allergic reaction to surgical materials or ointments.  Damage to nerves, blood vessels, muscles, or other structures.  Continued pain. BEFORE THE PROCEDURE  It is important to follow your caregiver's instructions prior to your procedure to avoid complications. Steps before your procedure may include:  Physical exam, blood tests, other procedures, such as removing a small sample for examination under a microscope (biopsy).  Your caregiver may review the procedure, the anesthesia being used, and what to expect after the procedure with you. You may be asked to:  Stop taking certain medicines, such as blood thinners (including aspirin, clopidogrel, ibuprofen), for several days prior to your procedure.  Take certain medicines.  Stop smoking. It is a good idea to arrange for a ride home after surgery and to have someone to help you with activities during recovery. PROCEDURE  There are several excision techniques. The type of excision or surgical technique used will depend on your condition, the location of the lesion, and your overall health. After the lesion is sterilized and a local anesthetic is applied, the following may be performed: Complete surgical excision The area  to be removed is marked with a pen. Using a small scalpel and scissors, the surgeon gently cuts around and under the lesion until it is completely removed. The lesion is placed in a special fluid and sent to the lab for examination. If necessary, bleeding will be controlled with a device that delivers heat. The edges of the wound are stitched together and a dressing is applied. This procedure may be performed to treat a cancerous growth or  noncancerous cyst or lesion. Surgeons commonly perform an elliptical excision, to minimize scarring. Excision of a cyst The surgeon makes an incision on the cyst. The entire cyst is removed through the incision. The wound may be closed with a suture (stitch). Shave excision During shave excision, the surgeon uses a small blade or loop instrument to shave off the lesion. This may be done to remove a mole or skin tag. The wound is usually left to heal on its own without stitches. Punch excision During punch excision, the surgeon uses a small, round tool (like a cookie cutter) to cut a circle shape out of the skin. The outer edges of the skin are stitched together. This may be done to remove a mole or scar or to perform a biopsy of the lesion. Mohs micrographic surgery During Mohs micrographic surgery, layers of the lesion are removed with a scalpel or loop instrument and immediately examined under a microscope until all of the abnormal or cancerous tissue is removed. This procedure is minimally invasive and ensures the best cosmetic outcome, with removal of as little normal tissue as possible. Mohs is usually done to treat skin cancer, such as basal cell carcinoma or squamous cell carcinoma, particularly on the face and ears. Antibiotic ointment is applied to the surgical area after each of the procedures listed above, as necessary. AFTER THE PROCEDURE  How well you heal depends on many factors. Most patients heal quite well with proper techniques and self-care. Scarring will lessen over time. HOME CARE INSTRUCTIONS   Take medicines for pain as directed.  Keep the incision area clean, dry, and protected for at least 48 hours. Change dressings as directed.  For bleeding, apply gentle but firm pressure to the wound using a folded towel for 20 minutes. Call your caregiver if bleeding does not stop.  Avoid high-impact exercise and activities until the stitches are removed or the area heals.  Follow  your caregiver's instructions to minimize scarring. Avoid sun exposure until the area has healed. Scarring should lessen over time.  Follow up with your caregiver as directed. Removal of stitches within 4 to 14 days may be necessary. Finding out the results of your test Not all test results are available during your visit. If your test results are not back during the visit, make an appointment with your caregiver to find out the results. Do not assume everything is normal if you have not heard from your caregiver or the medical facility. It is important for you to follow up on all of your test results. SEEK MEDICAL CARE IF:   You or your child has an oral temperature above 102 F (38.9 C).  You develop signs of infection (chills, feeling unwell).  You notice bleeding, pain, discharge, redness, or swelling at the incision site.  You notice skin irregularities or changes in sensation. MAKE SURE YOU:   Understand these instructions.  Will watch your condition.  Will get help right away if you are not doing well or get worse. FOR MORE INFORMATION  American Academy of Family Physicians: www.https://powers.com/ American Academy of Dermatology: InfoExam.si Document Released: 12/27/2009 Document Revised: 12/25/2011 Document Reviewed: 12/27/2009 [facility] Patient Information 2015 [place], [place]. This information is not intended to replace advice given to you by your health care provider. Make sure you discuss any questions you have with your health care provider.    Emergency Department Resource Guide 1) Find a Doctor and Pay Out of Pocket Although you won't have to find out who is covered by your insurance plan, it is a good idea to ask around and get recommendations. You will then need to call the office and see if the doctor you have chosen will accept you as a new patient and what types of options they offer for patients who are self-pay. Some doctors offer discounts or will set up payment plans for  their patients who do not have insurance, but you will need to ask so you aren't surprised when you get to your appointment.  2) Contact Your Local Health Department Not all health departments have doctors that can see patients for sick visits, but many do, so it is worth a call to see if yours does. If you don't know where your local health department is, you can check in your phone book. The CDC also has a tool to help you locate your state's health department, and many state websites also have listings of all of their local health departments.  3) Find a Walk-in Clinic If your illness is not likely to be very severe or complicated, you may want to try a walk in clinic. These are popping up all over the country in pharmacies, drugstores, and shopping centers. They're usually staffed by nurse practitioners or physician assistants that have been trained to treat common illnesses and complaints. They're usually fairly quick and inexpensive. However, if you have serious medical issues or chronic medical problems, these are probably not your best option.  No Primary Care Doctor: - Call Health Connect at  [phone] - they can help you locate a primary care doctor that  accepts your insurance, provides certain services, etc. - Physician Referral Service- [phone]  Chronic Pain Problems: Organization         Address  Phone   Notes  [facility]  [phone] Patients need to be referred by their primary care doctor.   Medication Assistance: Organization         Address  Phone   Notes  [facility] Medication [facility] [address] [place], [place] [address] [phone] --Must be a resident of [facility] -- Must have NO insurance coverage whatsoever (no Medicaid/ Medicare, etc.) -- The pt. MUST have a primary care doctor that directs their care regularly and follows them in the community   [facility]  [phone]   [employer]  [phone]    Agencies that provide inexpensive medical care: Organization         Address  Phone   Notes  [facility]  [phone]   [facility]    [phone]   [facility] [address] [place], [place] [address] [phone]   [facility] [address] [place] [place] [phone]   Planned Parenthood    [phone]   [facility]    [phone]   [facility]  [address], [place] Phone:  [phone], Fax:  (  [phone] Hours of Operation:  9 am - 6 pm, M-F.  Also accepts Medicaid/Medicare and self-pay.  [facility]  [address], [place] Phone: [phone]) [phone], Fax: [phone]. Hours of Operation:  8:30 am - 5:30 pm, M-F.  Also accepts Medicaid and self-pay.  [facility] [place] [address], [place] Phone: [phone]) [phone]   [facility] [address], [place], [place] [phone], Ext. [phone] Mondays & Thursdays: 7-9 AM.  First 15 patients are seen on a first come, first serve basis.    Medicaid-accepting [facility] Providers:  Organization         Address  Phone   Notes  [facility] [address], [place] [phone]) [phone] Also accepts self-pay patients.  [facility] [address], [place]  [phone]) [phone]   [facility] [address], [place] [phone]) [phone]   [facility] [address] [place] [address], [place] [phone]) [phone]   Renaye RakersVeita Bland [address], [place]   [phone] Only accepts [place] Access [place] patients after they have their name applied to their card.   Self-Pay (no insurance) in [facility]:  Organization         Address  Phone   Notes  Sickle Cell Patients, [facility] [address], [place] [phone])  [phone]   [facility] [address], [place] [phone]) [phone]   [facility]  [address], [place] [phone]) [phone]   [facility]/Dr. Osei-Bonsu  [address] [place] [address], [place] or [phone], [place] [phone] Phone number for both [place] and [place] locations is the same.  [facility] [address], [place] [phone]   [facility] [address] [place] [address], [place] or [address] [phone]) [phone]   [facility] [address], [place] [phone](336) [phone], phone; [phone]) [phone], fax Sees patients 1st and 3rd Saturday of every month.  Must not qualify for public or private insurance (i.e. Medicaid, Medicare, [place] Health Choice, Veterans' Benefits)  Household income should be no more than 200% of the poverty level The clinic cannot treat you if you are pregnant or think you are pregnant  Sexually transmitted diseases are not treated at the clinic.    Dental Care: Organization         Address  Phone  Notes  [facility] of [facility] [address], [place] [phone]) [phone] Accepts children up to age 38 who are enrolled in [place] or [place] Health Choice; pregnant women with a Medicaid card; and children who have applied for Medicaid or [place] Health Choice, but were declined, whose parents can pay a reduced fee at time of service.  [facility] [place]  [address], [place] [phone] Accepts children up to age 38 who are enrolled in [place] or [place] Health Choice; pregnant women with a Medicaid card; and children who have applied for Medicaid or  Health Choice, but were declined, whose parents can pay a reduced fee at time of service.  [facility]  [address], [place] [phone] Patients are  seen by appointment only. Walk-ins are not accepted. [facility] will see patients 66 years of age and older. Monday - Tuesday (8am-5pm) Most Wednesdays (8:30-5pm) $30 per visit, cash only  [facility] Access [facility]  [address], [facility] [phone] Patients are seen by appointment only. Walk-ins are not accepted. [facility] will see patients 22 years of age and older. One Wednesday Evening (Monthly: Volunteer Based).  $30 per visit, cash only  [employer]  [phone] for adults; Children under age 52, call Graduate Pediatric Dentistry at [phone]. Children aged 102-14, please call [phone] to request a pediatric application.  Dental services are provided in all areas of dental care including fillings, crowns and bridges, complete and partial dentures, implants, gum treatment, root canals, and extractions. Preventive care is also provided. Treatment is provided to both adults and children. Patients are selected via a lottery and there is often a waiting list.   [facility] [address], [place]  [phone] [email or web address]   [facility] [address] [place], [place] [phone], Ext. [phone] Second and Fourth Thursday of each month, opens at 6:30 AM; Clinic ends at 9 AM.  Patients are seen on a first-come first-served basis, and a limited number are seen during each clinic.   [facility]  [address] [place] [place], [place] [phone]   Eligibility Requirements You must have lived in [place], [place], or [place] counties for at least the last three months.   You cannot be eligible for state or federal sponsored [place], including [employer], [place], or [employer].   You generally cannot be eligible for healthcare insurance through your employer.    How to apply: Eligibility screenings are held every Tuesday and Wednesday afternoon from 1:00 pm until 4:00  pm. You do not need an appointment for the interview!  [facility] [address], [place], [place] [phone]   [facility] Health Department  [phone]   [facility] Health Department  [phone]   [facility] Health Department  [phone]    Behavioral Health Resources in the Community: Intensive Outpatient Programs Organization         Address  Phone  Notes  [facility] [address] [place], [place] [phone]   [facility] [address], [place], [place] [phone]   [facility] [address] [place], [place]  [phone]   [facility] Mental Health [address]  [place], [place] [phone] or [phone]   Substance Abuse Resources Organization         Address  Phone  Notes  [facility] Drug Services  [phone]   [facility]  [phone]   [facility] [place]  [phone]   Floydene Flock  [phone]   Residential & Outpatient Substance Abuse Program  [phone]   Psychological Services Organization         Address  Phone  Notes  [facility]  [phone]   [facility]  [phone]   [facility] Mental Health [address] [place] [phone] or [phone]    Mobile Crisis Teams Organization         Address  Phone  Notes  [facility], Mobile Crisis Care Unit  [phone]   [facility]  [address] [place], [place] [phone]   [facility] [address] [place] [phone]    Self-Help/Support Groups Organization  Address  Phone             Notes  [facility] [place] - variety of support groups  [phone]- [phone] Call for more information  Narcotics Anonymous (NA), [facility] [address], [employer] Register  2 meetings at this location   Statistician          Address  Phone  Notes  [facility] [address],    [place]  [phone]   [facility]  [address] [place] [address], [place], [place] [phone]   [facility] [address] [place], [place] [phone] Admissions: 8am-3pm M-F  [facility] [address]    [place], [place] [phone]   [facility] [address] [place], [place], [place] [phone]   [facility] [address],  [place], [place] [phone]   [facility] - Intensive Outpatient [address], [place], [place] [phone]   [facility] ([facility] [address] [place]  [place], [place] [phone] or [phone]   [facility] [address], [place], [place] [phone] Accepts Medicaid  [facility] [address]  [place] [phone] Substance Abuse/Addiction Treatment   [facility]         Address  Phone  Notes  [facility]  [phone]   Angie Fava, PhD [address] [place]vin Knack [place], [place]   [phone] or [phone]   [facility]   [address] [place], [place] [phone]   [facility] [address] [place], [place] [phone] Insurance/Medicaid/sponsorship through [facility] [address]                                    [place], [place] [phone] Therapy/tele-psych/case  [facility] [address], [place] [phone]    Dr. Lolly Mustache  [phone]   [facility]  [employer] [facility] Dept. 1) [address], [place] 2) [address] [place] 3)  [address], [place] [phone]  [phone]   [facility] Child Abuse Hotline [phone] or [phone] (After Hours)

## 2015-04-16 NOTE — ED Provider Notes (Signed)
CSN: [id number]     Arrival date & time 04/16/15  2201 History   This chart was scribed for Oswaldo ConroyVictoria Dontrelle Mazon, PA-C working with No att. providers found by Elveria Risingimelie Horne, ED Scribe. This patient was seen in room TR07C/TR07C and the patient's care was started at 10:38 PM.   Chief Complaint  Patient presents with  . Mass    The history is provided by the patient. No language interpreter was used.   HPI Comments: April Barr is a 39 y.o. female who presents to the Emergency Department complaining of mass to dorsum of right forearm. Patient states that the mass has been present for several years, but she has recently noticed growth. Patient denies pain with applied pressure, but reports pain with activities such as opening doors. Patient denies fever, chills, redness, swelling, numbness, or tingling.      Past Medical History  Diagnosis Date  . Hypertension   . Headache(784.0)   . Pregnancy induced hypertension    Past Surgical History  Procedure Laterality Date  . Laparoscopic tubal ligation  06/14/2011    Procedure: LAPAROSCOPIC TUBAL LIGATION;  Surgeon: Mickel Baasichard D Kaplan;  Location: [facility];  Service: Gynecology;  Laterality: Bilateral;   Family History  Problem Relation Age of Onset  . Anesthesia problems Neg Hx    History  Substance Use Topics  . Smoking status: Never Smoker   . Smokeless tobacco: Never Used  . Alcohol Use: Yes   OB History    Gravida Para Term Preterm AB TAB SAB Ectopic Multiple Living   5 4 4  0 1 1 0 0 0 4      Obstetric Comments   History updated per review of prenatal record to correct G/P.     Review of Systems  Constitutional: Negative for fever and chills.  Musculoskeletal: Negative for joint swelling and arthralgias.  Skin: Negative for color change and wound.       Mass       Allergies  Review of patient's allergies indicates no known allergies.  Home Medications   Prior to Admission medications   Medication Sig Start Date End Date  Taking? Authorizing Provider  Ferrous Sulfate 28 MG TABS Take 1 tablet (28 mg total) by mouth daily. 11/24/11   Peggy Constant, MD  hydrochlorothiazide (MICROZIDE) 12.5 MG capsule Take 1 capsule (12.5 mg total) by mouth daily. 11/24/11 11/23/12  Peggy Constant, MD  ibuprofen (ADVIL,MOTRIN) 200 MG tablet Take 200 mg by mouth as needed. For headache     Historical Provider, MD  medroxyPROGESTERone (PROVERA) 10 MG tablet Take 1 tablet (10 mg total) by mouth daily. 11/24/11 11/23/12  Peggy Constant, MD   Triage Vitals: BP 159/103 mmHg  Pulse 96  Temp(Src) 98.3 F (36.8 C) (Oral)  Resp 16  SpO2 94%  LMP 04/02/2015 Physical Exam  Constitutional: She appears well-developed and well-nourished. No distress.  HENT:  Head: Normocephalic and atraumatic.  Eyes: Conjunctivae are normal. Right eye exhibits no discharge. Left eye exhibits no discharge.  Pulmonary/Chest: Effort normal. No respiratory distress.  Neurological: She is alert. Coordination normal.  Skin: She is not diaphoretic.  2cm cutaneous mass to right forearm ulnar side without fluctuance. Fully mobile. No evidence of cellulitis. No red streaks. 2+ radial pulses on right. 5/5 strength. Full ROM. Sensation intact.   Psychiatric: She has a normal mood and affect. Her behavior is normal.  Nursing note and vitals reviewed.   ED Course  Procedures (including critical care time)  COORDINATION OF CARE: 10:44  PM- Discussed treatment plan with patient at bedside and patient agreed to plan.   Labs Review Labs Reviewed - No data to display  Imaging Review No results found.   EKG Interpretation None      MDM   Final diagnoses:  Mass of right forearm  Essential hypertension   Patient presenting with cutaneous mass of right forearm that he's fully mobile without any evidence of cellulitis. Patient to follow-up with surgery with persistent pain for further management. Patient also with hypertension in the ED. She denies headache, visual  changes, chest pain, shortness of breath, hematuria. [facility] and [facility]. Patient well appearing nontoxic and stable for discharge.  Discussed return precautions with patient. Discussed all results and patient verbalizes understanding and agrees with plan.  I personally performed the services described in this documentation, which was scribed in my presence. The recorded information has been reviewed and is accurate.  Filed Vitals:   04/16/15 2209 04/16/15 2253  BP: 159/103 147/95  Pulse: 96 94  Temp: 98.3 F (36.8 C)   TempSrc: Oral   Resp: 16 18  SpO2: 94% 98%     Oswaldo Conroy, PA-C 04/16/15 2341  Jerelyn Scott, MD 04/17/15 [phone]
